# Patient Record
Sex: Female | Born: 1960 | Race: White | Hispanic: No | Marital: Married | Smoking: Former smoker
Health system: Southern US, Community
[De-identification: ages and names within clinical notes are randomized; demographics above are authoritative.]

## PROBLEM LIST (undated history)

## (undated) DIAGNOSIS — E785 Hyperlipidemia, unspecified: Secondary | ICD-10-CM

## (undated) DIAGNOSIS — F32A Depression, unspecified: Secondary | ICD-10-CM

## (undated) DIAGNOSIS — F419 Anxiety disorder, unspecified: Secondary | ICD-10-CM

## (undated) DIAGNOSIS — I10 Essential (primary) hypertension: Secondary | ICD-10-CM

## (undated) DIAGNOSIS — M4800 Spinal stenosis, site unspecified: Secondary | ICD-10-CM

## (undated) DIAGNOSIS — T7840XA Allergy, unspecified, initial encounter: Secondary | ICD-10-CM

## (undated) DIAGNOSIS — M1990 Unspecified osteoarthritis, unspecified site: Secondary | ICD-10-CM

## (undated) DIAGNOSIS — F329 Major depressive disorder, single episode, unspecified: Secondary | ICD-10-CM

## (undated) DIAGNOSIS — I639 Cerebral infarction, unspecified: Secondary | ICD-10-CM

## (undated) HISTORY — PX: TONSILLECTOMY: SUR1361

## (undated) HISTORY — DX: Depression, unspecified: F32.A

## (undated) HISTORY — PX: TOTAL KNEE ARTHROPLASTY: SHX125

## (undated) HISTORY — PX: BACK SURGERY: SHX140

## (undated) HISTORY — PX: ENDOMETRIAL ABLATION: SHX621

## (undated) HISTORY — PX: CHOLECYSTECTOMY: SHX55

## (undated) HISTORY — DX: Allergy, unspecified, initial encounter: T78.40XA

## (undated) HISTORY — DX: Spinal stenosis, site unspecified: M48.00

## (undated) HISTORY — PX: APPENDECTOMY: SHX54

## (undated) HISTORY — DX: Cerebral infarction, unspecified: I63.9

## (undated) HISTORY — DX: Hyperlipidemia, unspecified: E78.5

---
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
## 2015-10-26 ENCOUNTER — Encounter (INDEPENDENT_AMBULATORY_CARE_PROVIDER_SITE_OTHER): Payer: Self-pay | Admitting: *Deleted

## 2015-12-08 ENCOUNTER — Encounter (INDEPENDENT_AMBULATORY_CARE_PROVIDER_SITE_OTHER): Payer: Self-pay | Admitting: *Deleted

## 2015-12-08 ENCOUNTER — Other Ambulatory Visit (INDEPENDENT_AMBULATORY_CARE_PROVIDER_SITE_OTHER): Payer: Self-pay | Admitting: *Deleted

## 2015-12-08 ENCOUNTER — Other Ambulatory Visit (INDEPENDENT_AMBULATORY_CARE_PROVIDER_SITE_OTHER): Payer: Self-pay | Admitting: Internal Medicine

## 2015-12-08 DIAGNOSIS — Z1211 Encounter for screening for malignant neoplasm of colon: Secondary | ICD-10-CM

## 2015-12-08 DIAGNOSIS — Z800 Family history of malignant neoplasm of digestive organs: Secondary | ICD-10-CM

## 2015-12-08 NOTE — Telephone Encounter (Signed)
Needs trilyte 

## 2015-12-12 MED ORDER — PEG 3350-KCL-NA BICARB-NACL 420 G PO SOLR: 4000.0000 mL | Freq: Once | ORAL | Status: DC

## 2015-12-14 ENCOUNTER — Telehealth (INDEPENDENT_AMBULATORY_CARE_PROVIDER_SITE_OTHER): Payer: Self-pay | Admitting: *Deleted

## 2015-12-14 NOTE — Telephone Encounter (Signed)
Referring MD: lahti PCP: addis   Procedure: tcs w propofol  Reason/Indication:  Screening, fam hx colon ca  Has patient had this procedure before?  Yes, 10 yrs ago  If so, when, by whom and where?    Is there a family history of colon cancer?  Yes, mother, maternal aunt  Who?  What age when diagnosed?    Is patient diabetic?   no      Does patient have prosthetic heart valve or mechanical valve?  no  Do you have a pacemaker?  no  Has patient ever had endocarditis? no  Has patient had joint replacement within last 12 months?  no  Does patient tend to be constipated or take laxatives? some  Does patient have a history of alcohol/drug use?  no  Is patient on Coumadin, Plavix and/or Aspirin? yes  Medications: asa 81 mg daily, vit b12, vit d3, tizanidine 4 mg daily, gabapentin 300 mg daily, escitalopram 10 mg daily, vit c, tramadol 50 mg prn, clonazepam 0.5 mg bid, hctz 12.5 mg daily  Allergies: sulfur drugs  Medication Adjustment: asa 2 days  Procedure date & time: 01/12/16 at 150

## 2015-12-15 NOTE — Telephone Encounter (Signed)
agree

## 2016-01-04 NOTE — Patient Instructions (Signed)
Robin Hull  01/04/2016     @PREFPERIOPPHARMACY @   Your procedure is scheduled on 01/12/2016.  Report to [facility] at 12:20 A.M.  Call this number if you have problems the morning of surgery:  [phone]   Remember:  Do not eat food or drink liquids after midnight.  Take these medicines the morning of surgery with A SIP OF WATER Xanax, Zyrtec, Klonopin, Lexapro, Gabapentin, Zanaflex, Ultram   Do not wear jewelry, make-up or nail polish.  Do not wear lotions, powders, or perfumes.  You may wear deodorant.  Do not shave 48 hours prior to surgery.  Men may shave face and neck.  Do not bring valuables to the hospital.  [facility] belongings or valuables.  Contacts, dentures or bridgework may not be worn into surgery.  Leave your suitcase in the car.  After surgery it may be brought to your room.  For patients admitted to the hospital, discharge time will be determined by your treatment team.  Patients discharged the day of surgery will not be allowed to drive home.    Please read over the following fact sheets that you were given. Anesthesia Post-op Instructions     PATIENT INSTRUCTIONS POST-ANESTHESIA  IMMEDIATELY FOLLOWING SURGERY:  Do not drive or operate machinery for the first twenty four hours after surgery.  Do not make any important decisions for twenty four hours after surgery or while taking narcotic pain medications or sedatives.  If you develop intractable nausea and vomiting or a severe headache please notify your doctor immediately.  FOLLOW-UP:  Please make an appointment with your surgeon as instructed. You do not need to follow up with anesthesia unless specifically instructed to do so.  WOUND CARE INSTRUCTIONS (if applicable):  Keep a dry clean dressing on the anesthesia/puncture wound site if there is drainage.  Once the wound has quit draining you may leave it open to air.  Generally you should leave the bandage intact for  twenty four hours unless there is drainage.  If the epidural site drains for more than 36-48 hours please call the anesthesia department.  QUESTIONS?:  Please feel free to call your physician or the hospital operator if you have any questions, and they will be happy to assist you.      Colonoscopy A colonoscopy is an exam to look at the entire large intestine (colon). This exam can help find problems such as tumors, polyps, inflammation, and areas of bleeding. The exam takes about 1 hour.  [facility] PROVIDER KNOW ABOUT:   Any allergies you have.  All medicines you are taking, including vitamins, herbs, eye drops, creams, and over-the-counter medicines.  Previous problems you or members of your family have had with the use of anesthetics.  Any blood disorders you have.  Previous surgeries you have had.  Medical conditions you have. RISKS AND COMPLICATIONS  Generally, this is a safe procedure. However, as with any procedure, complications can occur. Possible complications include:  Bleeding.  Tearing or rupture of the colon wall.  Reaction to medicines given during the exam.  Infection (rare). BEFORE THE PROCEDURE   Ask your health care provider about changing or stopping your regular medicines.  You may be prescribed an oral bowel prep. This involves drinking a large amount of medicated liquid, starting the day before your procedure. The liquid will cause you to have multiple loose stools until your stool is almost clear or light green. This cleans out  your colon in preparation for the procedure.  Do not eat or drink anything else once you have started the bowel prep, unless your health care provider tells you it is safe to do so.  Arrange for someone to drive you home after the procedure. PROCEDURE   You will be given medicine to help you relax (sedative).  You will lie on your side with your knees bent.  A long, flexible tube with a light and camera on the end  (colonoscope) will be inserted through the rectum and into the colon. The camera sends video back to a computer screen as it moves through the colon. The colonoscope also releases carbon dioxide gas to inflate the colon. This helps your health care provider see the area better.  During the exam, your health care provider may take a small tissue sample (biopsy) to be examined under a microscope if any abnormalities are found.  The exam is finished when the entire colon has been viewed. AFTER THE PROCEDURE   Do not drive for 24 hours after the exam.  You may have a small amount of blood in your stool.  You may pass moderate amounts of gas and have mild abdominal cramping or bloating. This is caused by the gas used to inflate your colon during the exam.  Ask when your test results will be ready and how you will get your results. Make sure you get your test results.   This information is not intended to replace advice given to you by your health care provider. Make sure you discuss any questions you have with your health care provider.   Document Released: 08/02/2000 Document Revised: 05/26/2013 Document Reviewed: 04/12/2013 Elsevier Interactive Patient Education [employer].

## 2016-01-08 ENCOUNTER — Encounter (HOSPITAL_COMMUNITY): Payer: Self-pay

## 2016-01-08 ENCOUNTER — Encounter (HOSPITAL_COMMUNITY)
Admission: RE | Admit: 2016-01-08 | Discharge: 2016-01-08 | Disposition: A | Discharge status: Home / Self Care | Payer: BLUE CROSS/BLUE SHIELD | Source: Ambulatory Visit | Attending: Internal Medicine | Admitting: Internal Medicine

## 2016-01-08 DIAGNOSIS — Z808 Family history of malignant neoplasm of other organs or systems: Secondary | ICD-10-CM | POA: Diagnosis not present

## 2016-01-08 DIAGNOSIS — Z01812 Encounter for preprocedural laboratory examination: Secondary | ICD-10-CM | POA: Insufficient documentation

## 2016-01-08 HISTORY — DX: Unspecified osteoarthritis, unspecified site: M19.90

## 2016-01-08 HISTORY — DX: Essential (primary) hypertension: I10

## 2016-01-08 HISTORY — DX: Anxiety disorder, unspecified: F41.9

## 2016-01-08 LAB — CBC WITH DIFFERENTIAL/PLATELET
Basophils Absolute: 0.1 10*3/uL (ref 0.0–0.1)
Basophils Relative: 1 %
EOS PCT: 1 %
Eosinophils Absolute: 0.1 10*3/uL (ref 0.0–0.7)
HEMATOCRIT: 41.1 % (ref 36.0–46.0)
Hemoglobin: 13.2 g/dL (ref 12.0–15.0)
LYMPHS ABS: 2.6 10*3/uL (ref 0.7–4.0)
LYMPHS PCT: 26 %
MCH: 28.3 pg (ref 26.0–34.0)
MCHC: 32.1 g/dL (ref 30.0–36.0)
MCV: 88 fL (ref 78.0–100.0)
MONO ABS: 0.6 10*3/uL (ref 0.1–1.0)
MONOS PCT: 6 %
NEUTROS ABS: 6.6 10*3/uL (ref 1.7–7.7)
Neutrophils Relative %: 66 %
PLATELETS: 387 10*3/uL (ref 150–400)
RBC: 4.67 MIL/uL (ref 3.87–5.11)
RDW: 12.8 % (ref 11.5–15.5)
WBC: 9.9 10*3/uL (ref 4.0–10.5)

## 2016-01-08 LAB — BASIC METABOLIC PANEL
ANION GAP: 8 (ref 5–15)
BUN: 23 mg/dL — AB (ref 6–20)
CALCIUM: 9.2 mg/dL (ref 8.9–10.3)
CO2: 28 mmol/L (ref 22–32)
Chloride: 101 mmol/L (ref 101–111)
Creatinine, Ser: 0.64 mg/dL (ref 0.44–1.00)
GFR calc Af Amer: >60 mL/min (ref >60)
GLUCOSE: 77 mg/dL (ref 65–99)
Potassium: 4.1 mmol/L (ref 3.5–5.1)
Sodium: 137 mmol/L (ref 135–145)

## 2016-01-12 ENCOUNTER — Ambulatory Visit (HOSPITAL_COMMUNITY)
Admission: RE | Admit: 2016-01-12 | Discharge: 2016-01-12 | Disposition: A | Discharge status: Home / Self Care | Payer: BLUE CROSS/BLUE SHIELD | Source: Ambulatory Visit | Attending: Internal Medicine | Admitting: Internal Medicine

## 2016-01-12 ENCOUNTER — Encounter (HOSPITAL_COMMUNITY): Payer: Self-pay | Admitting: *Deleted

## 2016-01-12 ENCOUNTER — Encounter (HOSPITAL_COMMUNITY)
Admission: RE | Disposition: A | Discharge status: Home / Self Care | Payer: Self-pay | Source: Ambulatory Visit | Attending: Internal Medicine

## 2016-01-12 ENCOUNTER — Ambulatory Visit (HOSPITAL_COMMUNITY): Payer: BLUE CROSS/BLUE SHIELD | Admitting: Anesthesiology

## 2016-01-12 DIAGNOSIS — K5520 Angiodysplasia of colon without hemorrhage: Secondary | ICD-10-CM | POA: Insufficient documentation

## 2016-01-12 DIAGNOSIS — Z800 Family history of malignant neoplasm of digestive organs: Secondary | ICD-10-CM | POA: Diagnosis not present

## 2016-01-12 DIAGNOSIS — Z87891 Personal history of nicotine dependence: Secondary | ICD-10-CM | POA: Insufficient documentation

## 2016-01-12 DIAGNOSIS — Z79899 Other long term (current) drug therapy: Secondary | ICD-10-CM | POA: Insufficient documentation

## 2016-01-12 DIAGNOSIS — K6389 Other specified diseases of intestine: Secondary | ICD-10-CM | POA: Insufficient documentation

## 2016-01-12 DIAGNOSIS — K644 Residual hemorrhoidal skin tags: Secondary | ICD-10-CM | POA: Diagnosis not present

## 2016-01-12 DIAGNOSIS — F419 Anxiety disorder, unspecified: Secondary | ICD-10-CM | POA: Diagnosis not present

## 2016-01-12 DIAGNOSIS — M1990 Unspecified osteoarthritis, unspecified site: Secondary | ICD-10-CM | POA: Diagnosis not present

## 2016-01-12 DIAGNOSIS — I10 Essential (primary) hypertension: Secondary | ICD-10-CM | POA: Insufficient documentation

## 2016-01-12 DIAGNOSIS — Z1211 Encounter for screening for malignant neoplasm of colon: Secondary | ICD-10-CM | POA: Insufficient documentation

## 2016-01-12 DIAGNOSIS — G4730 Sleep apnea, unspecified: Secondary | ICD-10-CM | POA: Diagnosis not present

## 2016-01-12 HISTORY — PX: COLONOSCOPY WITH PROPOFOL: SHX5780

## 2016-01-12 SURGERY — COLONOSCOPY WITH PROPOFOL
Anesthesia: Monitor Anesthesia Care

## 2016-01-12 MED ORDER — PROPOFOL 10 MG/ML IV BOLUS
INTRAVENOUS | Status: AC
  Filled 2016-01-12: qty 20

## 2016-01-12 MED ORDER — GLYCOPYRROLATE 0.2 MG/ML IJ SOLN
0.2000 mg | Freq: Once | INTRAMUSCULAR | Status: AC
  Administered 2016-01-12: 0.2 mg via INTRAVENOUS

## 2016-01-12 MED ORDER — LACTATED RINGERS IV SOLN
INTRAVENOUS | Status: DC
  Administered 2016-01-12: 09:00:00 via INTRAVENOUS

## 2016-01-12 MED ORDER — ONDANSETRON HCL 4 MG/2ML IJ SOLN: 4.0000 mg | Freq: Once | INTRAMUSCULAR | Status: DC | PRN

## 2016-01-12 MED ORDER — FENTANYL CITRATE (PF) 100 MCG/2ML IJ SOLN: 25.0000 ug | INTRAMUSCULAR | Status: DC | PRN

## 2016-01-12 MED ORDER — PHENYLEPHRINE HCL 10 MG/ML IJ SOLN
INTRAMUSCULAR | Status: AC
Start: 2016-01-12 — End: 2016-01-12
  Filled 2016-01-12: qty 1

## 2016-01-12 MED ORDER — ONDANSETRON HCL 4 MG/2ML IJ SOLN
4.0000 mg | Freq: Once | INTRAMUSCULAR | Status: AC
  Administered 2016-01-12: 4 mg via INTRAVENOUS

## 2016-01-12 MED ORDER — FENTANYL CITRATE (PF) 100 MCG/2ML IJ SOLN
25.0000 ug | INTRAMUSCULAR | Status: AC
  Administered 2016-01-12: 25 ug via INTRAVENOUS

## 2016-01-12 MED ORDER — MIDAZOLAM HCL 2 MG/2ML IJ SOLN
1.0000 mg | INTRAMUSCULAR | Status: DC | PRN
  Administered 2016-01-12: 2 mg via INTRAVENOUS

## 2016-01-12 MED ORDER — FENTANYL CITRATE (PF) 100 MCG/2ML IJ SOLN
INTRAMUSCULAR | Status: AC
  Filled 2016-01-12: qty 2

## 2016-01-12 MED ORDER — MIDAZOLAM HCL 5 MG/5ML IJ SOLN
INTRAMUSCULAR | Status: DC | PRN
  Administered 2016-01-12 (×2): 1 mg via INTRAVENOUS

## 2016-01-12 MED ORDER — SODIUM CHLORIDE 0.9 % IJ SOLN
INTRAMUSCULAR | Status: AC
  Filled 2016-01-12: qty 10

## 2016-01-12 MED ORDER — MIDAZOLAM HCL 2 MG/2ML IJ SOLN
INTRAMUSCULAR | Status: AC
  Filled 2016-01-12: qty 2

## 2016-01-12 MED ORDER — PROPOFOL 500 MG/50ML IV EMUL
INTRAVENOUS | Status: DC | PRN
  Administered 2016-01-12: 100 ug/kg/min via INTRAVENOUS
  Administered 2016-01-12: 125 ug/kg/min via INTRAVENOUS

## 2016-01-12 MED ORDER — GLYCOPYRROLATE 0.2 MG/ML IJ SOLN
INTRAMUSCULAR | Status: AC
  Filled 2016-01-12: qty 1

## 2016-01-12 MED ORDER — ONDANSETRON HCL 4 MG/2ML IJ SOLN
INTRAMUSCULAR | Status: AC
  Filled 2016-01-12: qty 2

## 2016-01-12 NOTE — Discharge Instructions (Signed)
Resume usual medications and high fiber diet. Can take Dulcolax suppository per rectum on as-needed basis. No driving for 24 hours. Next colonoscopy in 5 years.  Colonoscopy, Care After Refer to this sheet in the next few weeks. These instructions provide you with information on caring for yourself after your procedure. Your health care provider may also give you more specific instructions. Your treatment has been planned according to current medical practices, but problems sometimes occur. Call your health care provider if you have any problems or questions after your procedure. WHAT TO EXPECT AFTER THE PROCEDURE  After your procedure, it is typical to have the following:  A small amount of blood in your stool.  Moderate amounts of gas and mild abdominal cramping or bloating. HOME CARE INSTRUCTIONS  Do not drive, operate machinery, or sign important documents for 24 hours.  You may shower and resume your regular physical activities, but move at a slower pace for the first 24 hours.  Take frequent rest periods for the first 24 hours.  Walk around or put a warm pack on your abdomen to help reduce abdominal cramping and bloating.  Drink enough fluids to keep your urine clear or pale yellow.  You may resume your normal diet as instructed by your health care provider. Avoid heavy or fried foods that are hard to digest.  Avoid drinking alcohol for 24 hours or as instructed by your health care provider.  Only take over-the-counter or prescription medicines as directed by your health care provider.  If a tissue sample (biopsy) was taken during your procedure:  Do not take aspirin or blood thinners for 7 days, or as instructed by your health care provider.  Do not drink alcohol for 7 days, or as instructed by your health care provider.  Eat soft foods for the first 24 hours. SEEK MEDICAL CARE IF: You have persistent spotting of blood in your stool 2-3 days after the procedure. SEEK  IMMEDIATE MEDICAL CARE IF:  You have more than a small spotting of blood in your stool.  You pass large blood clots in your stool.  Your abdomen is swollen (distended).  You have nausea or vomiting.  You have a fever.  You have increasing abdominal pain that is not relieved with medicine.   This information is not intended to replace advice given to you by your health care provider. Make sure you discuss any questions you have with your health care provider.   Document Released: 03/19/2004 Document Revised: 05/26/2013 Document Reviewed: 04/12/2013 Elsevier Interactive Patient Education 2016 Elsevier Inc.  High-Fiber Diet Fiber, also called dietary fiber, is a type of carbohydrate found in fruits, vegetables, whole grains, and beans. A high-fiber diet can have many health benefits. Your health care provider may recommend a high-fiber diet to help:  Prevent constipation. Fiber can make your bowel movements more regular.  Lower your cholesterol.  Relieve hemorrhoids, uncomplicated diverticulosis, or irritable bowel syndrome.  Prevent overeating as part of a weight-loss plan.  Prevent heart disease, type 2 diabetes, and certain cancers. WHAT IS MY PLAN? The recommended daily intake of fiber includes:  38 grams for men under age 50.  30 grams for men over age 27.  25 grams for women under age 16.  21 grams for women over age 4. You can get the recommended daily intake of dietary fiber by eating a variety of fruits, vegetables, grains, and beans. Your health care provider may also recommend a fiber supplement if it is not possible to  get enough fiber through your diet. WHAT DO I NEED TO KNOW ABOUT A HIGH-FIBER DIET?  Fiber supplements have not been widely studied for their effectiveness, so it is better to get fiber through food sources.  Always check the fiber content on thenutrition facts label of any prepackaged food. Look for foods that contain at least 5 grams of fiber  per serving.  Ask your dietitian if you have questions about specific foods that are related to your condition, especially if those foods are not listed in the following section.  Increase your daily fiber consumption gradually. Increasing your intake of dietary fiber too quickly may cause bloating, cramping, or gas.  Drink plenty of water. Water helps you to digest fiber. WHAT FOODS CAN I EAT? Grains Whole-grain breads. Multigrain cereal. Oats and oatmeal. Brown rice. Barley. Bulgur wheat. Millet. Bran muffins. Popcorn. Rye wafer crackers. Vegetables Sweet potatoes. Spinach. Kale. Artichokes. Cabbage. Broccoli. Green peas. Carrots. Squash. Fruits Berries. Pears. Apples. Oranges. Avocados. Prunes and raisins. Dried figs. Meats and Other Protein Sources Navy, kidney, pinto, and soy beans. Split peas. Lentils. Nuts and seeds. Dairy Fiber-fortified yogurt. Beverages Fiber-fortified soy milk. Fiber-fortified orange juice. Other Fiber bars. The items listed above may not be a complete list of recommended foods or beverages. Contact your dietitian for more options. WHAT FOODS ARE NOT RECOMMENDED? Grains White bread. Pasta made with refined flour. White rice. Vegetables Fried potatoes. Canned vegetables. Well-cooked vegetables.  Fruits Fruit juice. Cooked, strained fruit. Meats and Other Protein Sources Fatty cuts of meat. Fried Environmental education officerpoultry or fried fish. Dairy Milk. Yogurt. Cream cheese. Sour cream. Beverages Soft drinks. Other Cakes and pastries. Butter and oils. The items listed above may not be a complete list of foods and beverages to avoid. Contact your dietitian for more information. WHAT ARE SOME TIPS FOR INCLUDING HIGH-FIBER FOODS IN MY DIET?  Eat a wide variety of high-fiber foods.  Make sure that half of all grains consumed each day are whole grains.  Replace breads and cereals made from refined flour or white flour with whole-grain breads and cereals.  Replace white  rice with brown rice, bulgur wheat, or millet.  Start the day with a breakfast that is high in fiber, such as a cereal that contains at least 5 grams of fiber per serving.  Use beans in place of meat in soups, salads, or pasta.  Eat high-fiber snacks, such as berries, raw vegetables, nuts, or popcorn.   This information is not intended to replace advice given to you by your health care provider. Make sure you discuss any questions you have with your health care provider.   Document Released: 08/05/2005 Document Revised: 08/26/2014 Document Reviewed: 01/18/2014 Elsevier Interactive Patient Education 2016 Elsevier Inc.  PATIENT INSTRUCTIONS POST-ANESTHESIA  IMMEDIATELY FOLLOWING SURGERY:  Do not drive or operate machinery for the first twenty four hours after surgery.  Do not make any important decisions for twenty four hours after surgery or while taking narcotic pain medications or sedatives.  If you develop intractable nausea and vomiting or a severe headache please notify your doctor immediately.  FOLLOW-UP:  Please make an appointment with your surgeon as instructed. You do not need to follow up with anesthesia unless specifically instructed to do so.  WOUND CARE INSTRUCTIONS (if applicable):  Keep a dry clean dressing on the anesthesia/puncture wound site if there is drainage.  Once the wound has quit draining you may leave it open to air.  Generally you should leave the bandage intact for twenty  four hours unless there is drainage.  If the epidural site drains for more than 36-48 hours please call the anesthesia department.  QUESTIONS?:  Please feel free to call your physician or the hospital operator if you have any questions, and they will be happy to assist you.

## 2016-01-12 NOTE — H&P (Signed)
Robin Hull is an 55 y.o. female.   Chief Complaint: Patient is here for colonoscopy. HPI: Patient is 55 year old Caucasian female who is here for screening colonoscopy. Last exam was normal 10 years ago. She has chronic constipation and is using polyethylene glycol. She has occasional hematochezia when she strains. She denies frank bleeding. Family history significant for CRC in mother who was 1062 at the time of diagnosis and required chemotherapy. She is doing fine. Her brother has had multiple colonic polyps.  Past Medical History  Diagnosis Date  . Hypertension   . Anxiety   . Arthritis     Past Surgical History  Procedure Laterality Date  . Total knee arthroplasty Right   . Cholecystectomy    . Appendectomy    . Back surgery      x 3  . Tonsillectomy    . Endometrial ablation      History reviewed. No pertinent family history. Social History:  reports that she quit smoking about 7 years ago. She does not have any smokeless tobacco history on file. She reports that she does not drink alcohol or use illicit drugs.  Allergies:  Allergies  Allergen Reactions  . Sulfa Antibiotics Nausea And Vomiting    Medications Prior to Admission  Medication Sig Dispense Refill  . ALPRAZolam (XANAX) 0.5 MG tablet Take 1 tablet by mouth as needed (ONLY NEEDS WHEN FLYING).   1  . cetirizine (ZYRTEC) 10 MG tablet Take 10 mg by mouth daily.    . clonazePAM (KLONOPIN) 0.5 MG tablet Take 0.5 mg by mouth 2 (two) times daily.  4  . [employer] D3 2000 units CAPS Take 1 capsule by mouth daily.  6  . [employer] VITAMIN B12 1000 MCG tablet Take 1 tablet by mouth daily.  6  . [employer] VITAMIN C 250 MG tablet Take 250 mg by mouth 2 (two) times daily.  1  . escitalopram (LEXAPRO) 10 MG tablet Take 10 mg by mouth daily.  6  . gabapentin (NEURONTIN) 300 MG capsule Take 300 mg by mouth 2 (two) times daily as needed (pain).   1  . hydrochlorothiazide (MICROZIDE) 12.5 MG capsule Take 12.5 mg by mouth daily.  1  . polyethylene  glycol-electrolytes (NULYTELY/GOLYTELY) 420 g solution Take 4,000 mLs by mouth once. 4000 mL 0  . tiZANidine (ZANAFLEX) 4 MG tablet Take 4 mg by mouth at bedtime.  1  . traMADol (ULTRAM) 50 MG tablet Take 50 mg by mouth 3 (three) times daily as needed for moderate pain.   0    No results found for this or any previous visit (from the past 48 hour(s)). No results found.  ROS  Blood pressure 127/65, temperature 97.6 F (36.4 C), temperature source Oral, resp. rate 18, SpO2 100 %. Physical Exam  Constitutional: She appears well-developed and well-nourished.  HENT:  Mouth/Throat: Oropharynx is clear and moist.  Eyes: Conjunctivae are normal. No scleral icterus.  Neck: No thyromegaly present.  Cardiovascular: Normal rate, regular rhythm and normal heart sounds.   No murmur heard. Respiratory: Effort normal and breath sounds normal.  GI:  Abdomen is obese but soft and nontender without organomegaly or masses.  Musculoskeletal: She exhibits no edema.  Lymphadenopathy:    She has no cervical adenopathy.  Neurological: She is alert.  Skin: Skin is warm and dry.     Assessment/Plan High risk screening colonoscopy under monitored anesthesia care. Family history of CRC in mother and multiple polyps in her brother.  Lionel DecemberNajeeb Caetano Oberhaus, MD 01/12/2016,  9:24 AM

## 2016-01-12 NOTE — Anesthesia Postprocedure Evaluation (Signed)
Anesthesia Post Note  Patient: Robin Hull  Procedure(s) Performed: Procedure(s) (LRB): COLONOSCOPY WITH PROPOFOL (N/A)  Patient location during evaluation: PACU Anesthesia Type: MAC Level of consciousness: awake, oriented and patient cooperative Pain management: pain level controlled Vital Signs Assessment: post-procedure vital signs reviewed and stable Respiratory status: spontaneous breathing, nonlabored ventilation and respiratory function stable Cardiovascular status: blood pressure returned to baseline Postop Assessment: no signs of nausea or vomiting Anesthetic complications: no    Last Vitals:  Filed Vitals:   01/12/16 0835 01/12/16 0840  BP: 133/64 127/65  Temp:    Resp: 13 18    Last Pain: There were no vitals filed for this visit.               Raynee Mccasland J

## 2016-01-12 NOTE — Transfer of Care (Signed)
Immediate Anesthesia Transfer of Care Note  Patient: Robin Hull  Procedure(s) Performed: Procedure(s) with comments: COLONOSCOPY WITH PROPOFOL (N/A) - 1:50-moved to 925 Ann notified pt  Patient Location: PACU  Anesthesia Type:MAC  Level of Consciousness: awake and patient cooperative  Airway & Oxygen Therapy: Patient Spontanous Breathing and Patient connected to face mask oxygen  Post-op Assessment: Report given to RN, Post -op Vital signs reviewed and stable and Patient moving all extremities  Post vital signs: Reviewed and stable  Last Vitals:  Filed Vitals:   01/12/16 0835 01/12/16 0840  BP: 133/64 127/65  Temp:    Resp: 13 18    Last Pain: There were no vitals filed for this visit.    Patients Stated Pain Goal: 7 (01/12/16 0746)  Complications: No apparent anesthesia complications

## 2016-01-12 NOTE — Anesthesia Preprocedure Evaluation (Signed)
Anesthesia Evaluation  Patient identified by MRN, date of birth, ID band Patient awake    Reviewed: Allergy & Precautions, NPO status , Patient's Chart, lab work & pertinent test results  Airway Mallampati: II  TM Distance: >3 FB     Dental  (+) Teeth Intact   Pulmonary sleep apnea (uses O2 at night) , former smoker,    breath sounds clear to auscultation       Cardiovascular hypertension, Pt. on medications  Rhythm:Regular Rate:Normal     Neuro/Psych PSYCHIATRIC DISORDERS Anxiety    GI/Hepatic   Endo/Other    Renal/GU      Musculoskeletal   Abdominal   Peds  Hematology   Anesthesia Other Findings   Reproductive/Obstetrics                             Anesthesia Physical Anesthesia Plan  ASA: II  Anesthesia Plan: MAC   Post-op Pain Management:    Induction: Intravenous  Airway Management Planned: Simple Face Mask  Additional Equipment:   Intra-op Plan:   Post-operative Plan:   Informed Consent: I have reviewed the patients History and Physical, chart, labs and discussed the procedure including the risks, benefits and alternatives for the proposed anesthesia with the patient or authorized representative who has indicated his/her understanding and acceptance.     Plan Discussed with:   Anesthesia Plan Comments:         Anesthesia Quick Evaluation

## 2016-01-12 NOTE — Op Note (Signed)
[facility] Patient Name: Robin Hull Procedure Date: 01/12/2016 9:16 AM MRN: [id number] Date of Birth: May 30, 1961 Attending MD: Lionel DecemberNajeeb Florabelle Cardin , MD CSN: [id number] Age: [age] Admit Type: Outpatient Procedure:                Colonoscopy Indications:              Screening in patient at increased risk: Colorectal                            cancer in mother 5660 or older Providers:                Lionel DecemberNajeeb Jamen Loiseau, MD, Brain HiltsLurae Albert, RN, Birder Robsonebra                            Houghton, Technician Referring MD:             Renaldo Harrisonaniel Addis, DO Medicines:                Propofol per Anesthesia Complications:            No immediate complications. Estimated Blood Loss:     Estimated blood loss: none. Procedure:                Pre-Anesthesia Assessment:                           - Prior to the procedure, a History and Physical                            was performed, and patient medications and                            allergies were reviewed. The patient's tolerance of                            previous anesthesia was also reviewed. The risks                            and benefits of the procedure and the sedation                            options and risks were discussed with the patient.                            All questions were answered, and informed consent                            was obtained. Prior Anticoagulants: The patient has                            taken no previous anticoagulant or antiplatelet                            agents. ASA Grade Assessment: II - A patient with  mild systemic disease. After reviewing the risks                            and benefits, the patient was deemed in                            satisfactory condition to undergo the procedure.                           After obtaining informed consent, the colonoscope                            was passed under direct vision. Throughout the                            procedure, the  patient's blood pressure, pulse, and                            oxygen saturations were monitored continuously. The                            EC-[id number] ([id number]) scope was introduced through                            the anus and advanced to the the cecum, identified                            by appendiceal orifice and ileocecal valve. The                            colonoscopy was performed without difficulty. The                            patient tolerated the procedure well. The quality                            of the bowel preparation was fair. Anatomical                            landmarks were photographed. The quality of the                            bowel preparation was fair. Scope In: 9:38:10 AM Scope Out: 9:52:05 AM Scope Withdrawal Time: 0 hours 7 minutes 38 seconds  Total Procedure Duration: 0 hours 13 minutes 55 seconds  Findings:      Three small angioectasias without bleeding were found at the hepatic       flexure and in the cecum.      A patchy area of mild melanosis was found in the entire colon.      External hemorrhoids were found during retroflexion. The hemorrhoids       were small. Impression:               - Preparation of the colon was fair.                           -  Three non-bleeding colonic angioectasias.                           - Melanosis in the colon.                           - External hemorrhoids.                           - No specimens collected. Moderate Sedation:      Per Anesthesia Care Recommendation:           - Patient has a contact number available for                            emergencies. The signs and symptoms of potential                            delayed complications were discussed with the                            patient. Return to normal activities tomorrow.                            Written discharge instructions were provided to the                            patient.                           - High fiber diet  today.                           - Continue present medications.                           - Repeat colonoscopy in 5 years for screening                            purposes. Procedure Code(s):        --- Professional ---                           [phone], Colonoscopy, flexible; diagnostic, including                            collection of specimen(s) by brushing or washing,                            when performed (separate procedure) Diagnosis Code(s):        --- Professional ---                           Z80.0, Family history of malignant neoplasm of                            digestive organs  K64.4, Residual hemorrhoidal skin tags                           K55.20, Angiodysplasia of colon without hemorrhage                           K63.89, Other specified diseases of intestine CPT copyright 2016 American Medical Association. All rights reserved. The codes documented in this report are preliminary and upon coder review may  be revised to meet current compliance requirements. Lionel December, MD Lionel December, MD 01/12/2016 9:59:20 AM This report has been signed electronically. Number of Addenda: 0

## 2016-01-19 ENCOUNTER — Encounter (HOSPITAL_COMMUNITY): Payer: Self-pay | Admitting: Internal Medicine

## 2018-12-09 ENCOUNTER — Ambulatory Visit (INDEPENDENT_AMBULATORY_CARE_PROVIDER_SITE_OTHER): Payer: BLUE CROSS/BLUE SHIELD | Admitting: Gastroenterology

## 2018-12-09 ENCOUNTER — Other Ambulatory Visit: Payer: Self-pay

## 2018-12-09 ENCOUNTER — Encounter: Payer: Self-pay | Admitting: Gastroenterology

## 2018-12-09 VITALS — BP 114/80 | Ht 63.5 in | Wt 215.0 lb

## 2018-12-09 DIAGNOSIS — M533 Sacrococcygeal disorders, not elsewhere classified: Secondary | ICD-10-CM | POA: Diagnosis not present

## 2018-12-09 DIAGNOSIS — K5902 Outlet dysfunction constipation: Secondary | ICD-10-CM

## 2018-12-09 DIAGNOSIS — R339 Retention of urine, unspecified: Secondary | ICD-10-CM

## 2018-12-09 DIAGNOSIS — M25559 Pain in unspecified hip: Secondary | ICD-10-CM

## 2018-12-09 MED ORDER — LINACLOTIDE 290 MCG PO CAPS: 290.0000 ug | ORAL_CAPSULE | Freq: Every day | ORAL | 3 refills | Status: AC

## 2018-12-09 NOTE — Patient Instructions (Addendum)
If you are age 57 or older, your body mass index should be between 23-30. Your Body mass index is 37.49 kg/m. If this is out of the aforementioned range listed, please consider follow up with your Primary Care Provider.  If you are age 23 or younger, your body mass index should be between 19-25. Your Body mass index is 37.49 kg/m. If this is out of the aformentioned range listed, please consider follow up with your Primary Care Provider.   You have been scheduled for an MRI at [facility] on              . Your appointment time is       . Please arrive 15 minutes prior to your appointment time for registration purposes. Please make certain not to have anything to eat or drink 6 hours prior to your test. In addition, if you have any metal in your body, have a pacemaker or defibrillator, please be sure to let your ordering physician know. This test typically takes 45 minutes to 1 hour to complete. Should you need to reschedule, please call [phone] to do so. [facility] WILL BE CONTACTING YOU.  We have sent the following medications to your pharmacy for you to pick up at your convenience: Linzess 290 mcg daily, take it on empty stomach in the morning before breakfast.  Glycerin suppository - use every other day along with daily digital rectal stimulation.  Continue pelvic floor physical therapy.  Follow-up in office visit/tele visit in 4 to 6 weeks after MRI.  Please call the office to make an appointment as the schedule is not available for June.  We will consider referral for EMG/pudendal nerve conduction study based on MRI findings   Thank you for choosing me and [facility].   Marsa Aris, MD

## 2018-12-09 NOTE — Progress Notes (Signed)
Robin Hull    [id number]    October 01, 1960  Primary Care Physician:Addis, Reuel Boom DO  Referring Physician: Renaldo Harrison, DO [address] [place], [place] [address]  This service was provided via audio and video telemedicine (Doximity) due to COVID 19 pandemic.  Patient location: Home Provider location: Office Used 2 patient identifiers to confirm the correct person. Explained the limitations in evaluation and management via telemedicine. Patient is aware of potential medical charges for this visit.  Patient consented to this virtual visit.  The persons participating in this telemedicine service were myself and the patient  Chief complaint:  Constipation HPI: 58 year old female here with complaints of constipation, difficulty evacuating since she had a fall in January 2020  Colonoscopy Jan 12, 2016 by Dr. Karilyn Cota showed 3 small angiectasia nonbleeding bleeding, mild melanosis coli and hemorrhoids.  Spinal stenosis surgery Dec 2018, complicated by blood clot, wound infection, had to redo surgery, wound VAC vac. IV antibiotc and oral antibiotics for almost 1 year.  Jan 2020, her foot got coaught in the tape and she had a fall, since then she is having pain during urination radiating down her groin, had some blood on UA, initially was thought to be urinary infection, treated with antibiotics with no improvement pain.  Since then she has also been constipated, had fecal impaction.  She can only evacuate when she gets an enema despite taking laxatives.  She is currently taking Linzess and Dulcolax with no improvement. She is undergoing pelvic floor physical therapy for dysuria and pelvic pain   Outpatient Encounter Medications as of 12/09/2018  Medication Sig  . ALPRAZolam (XANAX) 0.25 MG tablet Take 0.25 mg by mouth at bedtime as needed for anxiety. Insert in vagina once daily ? Not sure of mg  . ALPRAZolam (XANAX) 0.5 MG tablet Take 1 tablet by mouth as needed (ONLY NEEDS WHEN  FLYING).   Marland Kitchen cetirizine (ZYRTEC) 10 MG tablet Take 10 mg by mouth daily.  . clonazePAM (KLONOPIN) 0.5 MG tablet Take 0.5 mg by mouth 2 (two) times daily.  . [employer] D3 2000 units CAPS Take 1 capsule by mouth daily.  . [employer]S VITAMIN B12 1000 MCG tablet Take 1 tablet by mouth daily.  . [employer]S VITAMIN C 250 MG tablet Take 250 mg by mouth 2 (two) times daily.  Marland Kitchen escitalopram (LEXAPRO) 10 MG tablet Take 10 mg by mouth daily.  . hydrochlorothiazide (MICROZIDE) 12.5 MG capsule Take 12.5 mg by mouth daily.  Marland Kitchen HYDROcodone-acetaminophen (NORCO) 7.5-325 MG tablet Take 0.5 tablets by mouth 4 (four) times daily.  Marland Kitchen linaclotide (LINZESS) 72 MCG capsule Take 72 mcg by mouth daily before breakfast. NOT SURE OF MG  . pregabalin (LYRICA) 50 MG capsule Take 50 mg by mouth at bedtime. NOT SURE OF MG  . tiZANidine (ZANAFLEX) 4 MG tablet Take 4 mg by mouth at bedtime.  . [DISCONTINUED] gabapentin (NEURONTIN) 300 MG capsule Take 300 mg by mouth 2 (two) times daily as needed (pain).   . [DISCONTINUED] traMADol (ULTRAM) 50 MG tablet Take 50 mg by mouth 3 (three) times daily as needed for moderate pain.    No facility-administered encounter medications on file as of 12/09/2018.     Allergies as of 12/09/2018 - Review Complete 12/09/2018  Allergen Reaction Noted  . Sulfa antibiotics Nausea And Vomiting 12/29/2015    Past Medical History:  Diagnosis Date  . Anxiety   . Arthritis   . Hypertension   . Spinal stenosis  Past Surgical History:  Procedure Laterality Date  . APPENDECTOMY    . BACK SURGERY     x 3  . CHOLECYSTECTOMY    . COLONOSCOPY WITH PROPOFOL N/A 01/12/2016   Procedure: COLONOSCOPY WITH PROPOFOL;  Surgeon: Malissa Hippo, MD;  Location: [facility];  Service: Endoscopy;  Laterality: N/A;  1:50-moved to [address] notified pt  . ENDOMETRIAL ABLATION    . TONSILLECTOMY    . TOTAL KNEE ARTHROPLASTY Right     Family History  Problem Relation Age of Onset  . COPD Mother   . Colon cancer Mother    . Other Father        died of natural causes  . Arthritis Maternal Grandmother     Social History   Socioeconomic History  . Marital status: Married    Spouse name: Not on file  . Number of children: 1  . Years of education: Not on file  . Highest education level: Not on file  Occupational History  . Occupation: part time school teacher  Social Needs  . Financial resource strain: Not on file  . Food insecurity:    Worry: Not on file    Inability: Not on file  . Transportation needs:    Medical: Not on file    Non-medical: Not on file  Tobacco Use  . Smoking status: Former Smoker    Packs/day: 1.00    Years: 35.00    Pack years: 35.00    Last attempt to quit: 01/07/2009    Years since quitting: 9.9  . Smokeless tobacco: Never Used  Substance and Sexual Activity  . Alcohol use: No  . Drug use: No  . Sexual activity: Not on file  Lifestyle  . Physical activity:    Days per week: Not on file    Minutes per session: Not on file  . Stress: Not on file  Relationships  . Social connections:    Talks on phone: Not on file    Gets together: Not on file    Attends religious service: Not on file    Active member of club or organization: Not on file    Attends meetings of clubs or organizations: Not on file    Relationship status: Not on file  . Intimate partner violence:    Fear of current or ex partner: Not on file    Emotionally abused: Not on file    Physically abused: Not on file    Forced sexual activity: Not on file  Other Topics Concern  . Not on file  Social History Narrative  . Not on file      Review of systems: Review of Systems as per HPI All other systems reviewed and are negative.   Physical Exam: Vitals were not taken and physical exam was not performed during this virtual visit.  Data Reviewed:  Reviewed labs, radiology imaging, old records and pertinent past GI work up   Assessment and Plan/Recommendations:  58 year old female with  history of lumbar spinal surgery complicated by wound infection, with dysuria, pelvic pain, urinary retention, constipation with difficulty evacuating after a fall in January 2020 Her symptoms started acutely after the fall in January.  She lost sensation/urgency for both urine and feces.  Concerning for possible nerve injury or compression especially given her history of spinal surgery and acute symptoms after the fall Will obtain MRI lumbosacral spine and pelvis to further evaluate.  Based on findings of MRI may need to consider EMG/pudendal nerve  conduction study  Continue pelvic floor physical therapy Linzess 290 mcg daily Use glycerin suppositories every other day with daily digital rectal stimulation  Return in 4 to 6 weeks or sooner if needed    K. Scherry Ran , MD   CC: Renaldo Harrison, DO

## 2018-12-15 ENCOUNTER — Telehealth: Payer: Self-pay

## 2018-12-17 NOTE — Telephone Encounter (Signed)
Ok thanks 

## 2018-12-18 ENCOUNTER — Telehealth: Payer: Self-pay | Admitting: Gastroenterology

## 2018-12-18 NOTE — Telephone Encounter (Signed)
Called patient with no answer and no voicemail to leave a message. 

## 2018-12-18 NOTE — Telephone Encounter (Signed)
Patient said that the med Robin Hull is not working as it should she has only had 2 bowel moments in the pass week.

## 2018-12-21 NOTE — Telephone Encounter (Signed)
Dr Nandigam Please advise  

## 2018-12-22 NOTE — Telephone Encounter (Signed)
She has difficulty evacuating due to pelvic outlet dysfunction, will need to exclude no compression or injury. await results of MRI spine and pelvis   Continue Linzess 290 mcg daily and glycerin suppository with digital rectal stimulation every other day for now along with pelvic floor physical therapy

## 2018-12-23 NOTE — Telephone Encounter (Signed)
Yes agree,  if she is not able to evacuate despite high dose laxative , will need to do bowel purge with Miralax (mix 1/2 bottle in 64 oz liquid)

## 2018-12-23 NOTE — Telephone Encounter (Signed)
Patient returned phone call. Best # [phone]

## 2018-12-23 NOTE — Telephone Encounter (Signed)
Explained to patient Dr Sharol Given recommendations she will try the Miralax purge  Dr Lavon Paganini patient wants her MRI moved up to a sooner appt.[facility] moved her appt out because its not an emergency  Is she ok to wait or do I need to tall them the MRI is Urgent?

## 2018-12-23 NOTE — Telephone Encounter (Signed)
Patient is stating she has been using the Glycerin suppositories and the 290 Linzess and its not working......Marland Kitchen Also tried Fleet enemas that has not worked   Pts MRI has been moved till 6/16th    I mentioned maybe a bowel purge with Miralax and gaterade To help her.....Marland Kitchen I told her I would get with you and see what you want to do  Dr Lavon Paganini please advise

## 2018-12-24 NOTE — Telephone Encounter (Signed)
Please request them to do it sooner given she is having significant issue with her bladder and bowel.  I am concerned about possible nerve injury.  Thank you

## 2019-01-09 ENCOUNTER — Other Ambulatory Visit: Payer: Self-pay

## 2019-01-09 ENCOUNTER — Ambulatory Visit
Admission: RE | Admit: 2019-01-09 | Discharge: 2019-01-09 | Disposition: A | Discharge status: Home / Self Care | Payer: BLUE CROSS/BLUE SHIELD | Source: Ambulatory Visit | Attending: Gastroenterology | Admitting: Gastroenterology

## 2019-01-09 DIAGNOSIS — M533 Sacrococcygeal disorders, not elsewhere classified: Secondary | ICD-10-CM

## 2019-01-09 DIAGNOSIS — M25559 Pain in unspecified hip: Secondary | ICD-10-CM

## 2019-01-09 MED ORDER — GADOBENATE DIMEGLUMINE 529 MG/ML IV SOLN
20.0000 mL | Freq: Once | INTRAVENOUS | Status: AC | PRN
  Administered 2019-01-09: 20 mL via INTRAVENOUS

## 2019-01-14 ENCOUNTER — Ambulatory Visit (AMBULATORY_SURGERY_CENTER): Payer: Self-pay

## 2019-01-14 ENCOUNTER — Other Ambulatory Visit: Payer: Self-pay

## 2019-01-14 VITALS — Ht 63.5 in | Wt 208.0 lb

## 2019-01-14 DIAGNOSIS — K5902 Outlet dysfunction constipation: Secondary | ICD-10-CM

## 2019-01-14 MED ORDER — NA SULFATE-K SULFATE-MG SULF 17.5-3.13-1.6 GM/177ML PO SOLN: 1.0000 | Freq: Once | ORAL | 0 refills | Status: AC

## 2019-01-14 NOTE — Progress Notes (Signed)
Denies allergies to eggs or soy products. Denies complication of anesthesia or sedation. Denies use of weight loss medication. Denies use of O2.   Emmi instructions given for colonoscopy.  Pre-Visit was conducted by phone due to Covid 19. Instructions were reviewed and mailed to patients confirmed home address. Patient was encouraged to call if she had any questions or concerns. 

## 2019-01-15 ENCOUNTER — Encounter: Payer: Self-pay | Admitting: Gastroenterology

## 2019-01-18 ENCOUNTER — Telehealth: Payer: Self-pay

## 2019-01-18 NOTE — Telephone Encounter (Signed)
Covid-19 screening questions  Have you traveled in the last 14 days? Lives in [place], [place]. Other than that she has not traveled out of state. If yes where?  Do you now or have you had a fever in the last 14 days? No.  Do you have any respiratory symptoms of shortness of breath or cough now or in the last 14 days? No.  Do you have any family members or close contacts with diagnosed or suspected Covid-19 in the past 14 No. days?  Have you been tested for Covid-19 and found to be positive? No.

## 2019-01-19 ENCOUNTER — Telehealth: Payer: Self-pay | Admitting: *Deleted

## 2019-01-19 NOTE — Telephone Encounter (Signed)
Chart entered in error

## 2019-01-20 ENCOUNTER — Encounter: Payer: Self-pay | Admitting: Gastroenterology

## 2019-01-20 ENCOUNTER — Ambulatory Visit (AMBULATORY_SURGERY_CENTER): Payer: BC Managed Care – PPO | Admitting: Gastroenterology

## 2019-01-20 ENCOUNTER — Other Ambulatory Visit: Payer: Self-pay

## 2019-01-20 VITALS — BP 128/69 | HR 56 | Temp 97.7°F | Resp 14 | Ht 63.0 in | Wt 215.0 lb

## 2019-01-20 DIAGNOSIS — Z538 Procedure and treatment not carried out for other reasons: Secondary | ICD-10-CM | POA: Diagnosis not present

## 2019-01-20 DIAGNOSIS — R933 Abnormal findings on diagnostic imaging of other parts of digestive tract: Secondary | ICD-10-CM

## 2019-01-20 DIAGNOSIS — K629 Disease of anus and rectum, unspecified: Secondary | ICD-10-CM

## 2019-01-20 MED ORDER — SODIUM CHLORIDE 0.9 % IV SOLN: 500.0000 mL | Freq: Once | INTRAVENOUS | Status: DC

## 2019-01-20 MED ORDER — PEG 3350-KCL-NA BICARB-NACL 420 G PO SOLR: ORAL | 0 refills | Status: AC

## 2019-01-20 MED ORDER — METOCLOPRAMIDE HCL 5 MG PO TABS: ORAL_TABLET | ORAL | 0 refills | Status: AC

## 2019-01-20 NOTE — Patient Instructions (Signed)
Continue present medications.      YOU HAD AN ENDOSCOPIC PROCEDURE TODAY AT THE [facility]:   Refer to the procedure report that was given to you for any specific questions about what was found during the examination.  If the procedure report does not answer your questions, please call your gastroenterologist to clarify.  If you requested that your care partner not be given the details of your procedure findings, then the procedure report has been included in a sealed envelope for you to review at your convenience later.  YOU SHOULD EXPECT: Some feelings of bloating in the abdomen. Passage of more gas than usual.  Walking can help get rid of the air that was put into your GI tract during the procedure and reduce the bloating. If you had a lower endoscopy (such as a colonoscopy or flexible sigmoidoscopy) you may notice spotting of blood in your stool or on the toilet paper. If you underwent a bowel prep for your procedure, you may not have a normal bowel movement for a few days.  Please Note:  You might notice some irritation and congestion in your nose or some drainage.  This is from the oxygen used during your procedure.  There is no need for concern and it should clear up in a day or so.  SYMPTOMS TO REPORT IMMEDIATELY:   Following lower endoscopy (colonoscopy or flexible sigmoidoscopy):  Excessive amounts of blood in the stool  Significant tenderness or worsening of abdominal pains  Swelling of the abdomen that is new, acute  Fever of 100F or higher    For urgent or emergent issues, a gastroenterologist can be reached at any hour by calling [phone].   DIET: Clear liquid diet for next 48 hours to prep for endoscopy procedure on Friday.  ACTIVITY:  You should plan to take it easy for the rest of today and you should NOT DRIVE or use heavy machinery until tomorrow (because of the sedation medicines used during the test).    FOLLOW UP: Our staff will call the  number listed on your records 48-72 hours following your procedure to check on you and address any questions or concerns that you may have regarding the information given to you following your procedure. If we do not reach you, we will leave a message.  We will attempt to reach you two times.  During this call, we will ask if you have developed any symptoms of COVID 19. If you develop any symptoms (ie: fever, flu-like symptoms, shortness of breath, cough etc.) before then, please call [phone].  If you test positive for Covid 19 in the 2 weeks post procedure, please call and report this information to [place].    If any biopsies were taken you will be contacted by phone or by letter within the next 1-3 weeks.  Please call us at [phone] if you have not heard about the biopsies in 3 weeks.    SIGNATURES/CONFIDENTIALITY: You and/or your care partner have signed paperwork which will be entered into your electronic medical record.  These signatures attest to the fact that that the information above on your After Visit Summary has been reviewed and is understood.  Full responsibility of the confidentiality of this discharge information lies with you and/or your care-partner.

## 2019-01-20 NOTE — Op Note (Signed)
[facility] Patient Name: Robin Hull Procedure Date: 01/20/2019 2:42 PM MRN: [id number] Endoscopist: Napoleon Form , MD Age: 58 Referring MD:  Date of Birth: 04-May-1961 Gender: Female Account #: [id number] Procedure:                Colonoscopy Indications:              Abnormal CT of the GI tract Medicines:                Monitored Anesthesia Care Procedure:                Pre-Anesthesia Assessment:                           - Prior to the procedure, a History and Physical                            was performed, and patient medications and                            allergies were reviewed. The patient's tolerance of                            previous anesthesia was also reviewed. The risks                            and benefits of the procedure and the sedation                            options and risks were discussed with the patient.                            All questions were answered, and informed consent                            was obtained. Prior Anticoagulants: The patient has                            taken no previous anticoagulant or antiplatelet                            agents. ASA Grade Assessment: III - A patient with                            severe systemic disease. After reviewing the risks                            and benefits, the patient was deemed in                            satisfactory condition to undergo the procedure.                           After obtaining informed consent, the colonoscope  was passed under direct vision. Throughout the                            procedure, the patient's blood pressure, pulse, and                            oxygen saturations were monitored continuously. The                            Colonoscope was introduced through the anus with                            the intention of advancing to the cecum. The scope                            was advanced to the  rectum before the procedure was                            aborted. Medications were given. The colonoscopy                            was technically difficult and complex due to poor                            bowel prep with stool present. The patient                            tolerated the procedure well. The quality of the                            bowel preparation was poor. The rectum was                            photographed. Scope In: Scope Out: 2:53:30 PM Findings:                 The perianal and digital rectal examinations were                            normal.                           A large amount of stool was found in the rectum,                            interfering with visualization. Complications:            No immediate complications. Estimated Blood Loss:     Estimated blood loss: none. Impression:               - Preparation of the colon was poor.                           - Stool in the rectum.                           -  No specimens collected. Recommendation:           - Patient has a contact number available for                            emergencies. The signs and symptoms of potential                            delayed complications were discussed with the                            patient. Return to normal activities tomorrow.                            Written discharge instructions were provided to the                            patient.                           - Clear liquid diet.                           - Continue present medications.                           - Repeat colonoscopy at the next available                            appointment because the bowel preparation was                            suboptimal.                           - For future colonoscopy the patient will require                            an extended preparation with golytly. If there are                            any questions, please contact the                             gastroenterologist. Napoleon FormKavitha V. Aeriana Speece, MD 01/20/2019 3:01:02 PM This report has been signed electronically.

## 2019-01-20 NOTE — Progress Notes (Signed)
Pt to do golytely prep for procedure on 01-22-19.  She may take Reglan 5 mg po 30 minutes before each dose of prep.  RX sent to pharmacy  golytely prep instructions reviewed with husband

## 2019-01-20 NOTE — Progress Notes (Signed)
Pt's states no medical or surgical changes since previsit or office visit. 

## 2019-01-20 NOTE — Progress Notes (Signed)
To PACU, VSS. Report to [email or web address] 

## 2019-01-22 ENCOUNTER — Telehealth: Payer: Self-pay

## 2019-01-22 ENCOUNTER — Ambulatory Visit (AMBULATORY_SURGERY_CENTER): Payer: BC Managed Care – PPO | Admitting: Gastroenterology

## 2019-01-22 ENCOUNTER — Other Ambulatory Visit: Payer: Self-pay

## 2019-01-22 ENCOUNTER — Encounter: Payer: Self-pay | Admitting: Gastroenterology

## 2019-01-22 VITALS — BP 113/53 | HR 58 | Temp 98.4°F | Resp 15 | Ht 63.5 in | Wt 215.0 lb

## 2019-01-22 DIAGNOSIS — K5900 Constipation, unspecified: Secondary | ICD-10-CM

## 2019-01-22 DIAGNOSIS — K6289 Other specified diseases of anus and rectum: Secondary | ICD-10-CM | POA: Diagnosis not present

## 2019-01-22 DIAGNOSIS — K5520 Angiodysplasia of colon without hemorrhage: Secondary | ICD-10-CM

## 2019-01-22 DIAGNOSIS — K648 Other hemorrhoids: Secondary | ICD-10-CM | POA: Diagnosis not present

## 2019-01-22 DIAGNOSIS — R933 Abnormal findings on diagnostic imaging of other parts of digestive tract: Secondary | ICD-10-CM

## 2019-01-22 MED ORDER — NALOXEGOL OXALATE 25 MG PO TABS: 25.0000 mg | ORAL_TABLET | Freq: Every day | ORAL | 3 refills | Status: AC

## 2019-01-22 MED ORDER — SODIUM CHLORIDE 0.9 % IV SOLN: 500.0000 mL | Freq: Once | INTRAVENOUS | Status: DC

## 2019-01-22 NOTE — Patient Instructions (Signed)
Resume medications and diet   Return to GI clinic in 3 months-make this appointment   Use Benefiber one teaspoon by mouth as directed three times a day   Use Linzess 290 mcg one daily   Motvantik 25 mg daily   Continue pelvic floor physical therapy   YOU HAD AN ENDOSCOPIC PROCEDURE TODAY AT THE [facility]:   Refer to the procedure report that was given to you for any specific questions about what was found during the examination.  If the procedure report does not answer your questions, please call your gastroenterologist to clarify.  If you requested that your care partner not be given the details of your procedure findings, then the procedure report has been included in a sealed envelope for you to review at your convenience later.  YOU SHOULD EXPECT: Some feelings of bloating in the abdomen. Passage of more gas than usual.  Walking can help get rid of the air that was put into your GI tract during the procedure and reduce the bloating. If you had a lower endoscopy (such as a colonoscopy or flexible sigmoidoscopy) you may notice spotting of blood in your stool or on the toilet paper. If you underwent a bowel prep for your procedure, you may not have a normal bowel movement for a few days.  Please Note:  You might notice some irritation and congestion in your nose or some drainage.  This is from the oxygen used during your procedure.  There is no need for concern and it should clear up in a day or so.  SYMPTOMS TO REPORT IMMEDIATELY:   Following lower endoscopy (colonoscopy or flexible sigmoidoscopy):  Excessive amounts of blood in the stool  Significant tenderness or worsening of abdominal pains  Swelling of the abdomen that is new, acute  Fever of 100F or higher   For urgent or emergent issues, a gastroenterologist can be reached at any hour by calling [phone].   DIET:  We do recommend a small meal at first, but then you may proceed to your regular diet.   Drink plenty of fluids but you should avoid alcoholic beverages for 24 hours.  ACTIVITY:  You should plan to take it easy for the rest of today and you should NOT DRIVE or use heavy machinery until tomorrow (because of the sedation medicines used during the test).    FOLLOW UP: Our staff will call the number listed on your records 48-72 hours following your procedure to check on you and address any questions or concerns that you may have regarding the information given to you following your procedure. If we do not reach you, we will leave a message.  We will attempt to reach you two times.  During this call, we will ask if you have developed any symptoms of COVID 19. If you develop any symptoms (ie: fever, flu-like symptoms, shortness of breath, cough etc.) before then, please call [phone].  If you test positive for Covid 19 in the 2 weeks post procedure, please call and report this information to [place].    If any biopsies were taken you will be contacted by phone or by letter within the next 1-3 weeks.  Please call us at [phone] if you have not heard about the biopsies in 3 weeks.    SIGNATURES/CONFIDENTIALITY: You and/or your care partner have signed paperwork which will be entered into your electronic medical record.  These signatures attest to the fact that that the information above on  your After Visit Summary has been reviewed and is understood.  Full responsibility of the confidentiality of this discharge information lies with you and/or your care-partner. 

## 2019-01-22 NOTE — Op Note (Signed)
[facility] Patient Name: Robin LungerMelba Hull Procedure Date: 01/22/2019 9:27 AM MRN: [id number] Endoscopist: Napoleon FormKavitha V. Tamiko Leopard , MD Age: 58 Referring MD:  Date of Birth: 1961/03/16 Gender: Female Account #: [id number] Procedure:                Colonoscopy Indications:              Evaluation of abnormal imaging study, rectal                            thickening (likely to be clinically significant),                            Incidental change in bowel habits noted, Incidental                            constipation noted Medicines:                Monitored Anesthesia Care Procedure:                Pre-Anesthesia Assessment:                           - Prior to the procedure, a History and Physical                            was performed, and patient medications and                            allergies were reviewed. The patient's tolerance of                            previous anesthesia was also reviewed. The risks                            and benefits of the procedure and the sedation                            options and risks were discussed with the patient.                            All questions were answered, and informed consent                            was obtained. Prior Anticoagulants: The patient has                            taken no previous anticoagulant or antiplatelet                            agents. ASA Grade Assessment: III - A patient with                            severe systemic disease. After reviewing the risks  and benefits, the patient was deemed in                            satisfactory condition to undergo the procedure.                           After obtaining informed consent, the colonoscope                            was passed under direct vision. Throughout the                            procedure, the patient's blood pressure, pulse, and                            oxygen saturations were monitored  continuously. The                            Colonoscope was introduced through the anus and                            advanced to the the cecum, identified by                            appendiceal orifice and ileocecal valve. The                            colonoscopy was performed without difficulty. The                            patient tolerated the procedure well. The quality                            of the bowel preparation was adequate. The                            ileocecal valve, appendiceal orifice, and rectum                            were photographed. Scope In: 9:40:13 AM Scope Out: 10:02:18 AM Scope Withdrawal Time: 0 hours 12 minutes 26 seconds  Total Procedure Duration: 0 hours 22 minutes 5 seconds  Findings:                 The perianal and digital rectal examinations were                            normal.                           A single small localized angioectasia without                            bleeding was found in the cecum.  A patchy area of mild melanosis was found in the                            transverse colon, in the ascending colon and in the                            cecum.                           Non-bleeding internal hemorrhoids were found during                            retroflexion. The hemorrhoids were medium-sized.                           The exam was otherwise without abnormality. Complications:            No immediate complications. Estimated Blood Loss:     Estimated blood loss was minimal. Impression:               - A single non-bleeding colonic angioectasia.                           - Melanosis in the colon.                           - Non-bleeding internal hemorrhoids.                           - The examination was otherwise normal.                           - No specimens collected. Recommendation:           - Patient has a contact number available for                            emergencies.  The signs and symptoms of potential                            delayed complications were discussed with the                            patient. Return to normal activities tomorrow.                            Written discharge instructions were provided to the                            patient.                           - Resume previous diet.                           - Continue present medications.                           -  Repeat colonoscopy in 10 years for screening                            purposes.                           - Return to GI clinic in 3 months.                           - Use Benefiber one teaspoon PO TID.                           - Use Linzess (linaclotide) 290 mcg PO daily.                           - Movantik 25mg  daily X 30 days with 3 refills                           -Continue pelvic floor physical therapy Napoleon Form, MD 01/22/2019 10:10:22 AM This report has been signed electronically.

## 2019-01-22 NOTE — Telephone Encounter (Signed)
  Follow up Call-  Call back number 01/22/2019 01/20/2019  Post procedure Call Back phone  # [phone]  Permission to leave phone message Yes Yes  Some recent data might be hidden     Patient questions:  Do you have a fever, pain , or abdominal swelling? No. Pain Score  0 *  Have you tolerated food without any problems? Yes.    Have you been able to return to your normal activities? Yes.    Do you have any questions about your discharge instructions: Diet   No. Medications  No. Follow up visit  No.  Do you have questions or concerns about your Care? No.  Actions: * If pain score is 4 or above: No action needed, pain <4.   1. Have you developed a fever since your procedure? no  2.   Have you had an respiratory symptoms (SOB or cough) since your procedure? no  3.   Have you tested positive for COVID 19 since your procedure no  4.   Have you had any family members/close contacts diagnosed with the COVID 19 since your procedure?  no   If yes to any of these questions please route to Laverna Peace, RN and Jennye Boroughs, Charity fundraiser.

## 2019-01-22 NOTE — Telephone Encounter (Signed)
Attempted to reach patient for post-procedure f/u call. No answer. Left message that we will make another attempt to reach her again later today and for her to please not hesitate to call us if she has any concerns regarding her care.

## 2019-01-22 NOTE — Progress Notes (Signed)
Pt's states no medical or surgical changes since previsit or office visit. 

## 2019-01-22 NOTE — Progress Notes (Signed)
A/ox3, pleased with MAC, report to RN 

## 2019-01-26 ENCOUNTER — Telehealth: Payer: Self-pay

## 2019-01-26 ENCOUNTER — Other Ambulatory Visit: Payer: BLUE CROSS/BLUE SHIELD

## 2019-01-26 ENCOUNTER — Telehealth: Payer: Self-pay | Admitting: *Deleted

## 2019-01-26 NOTE — Telephone Encounter (Signed)
  Left message to call us as needed.

## 2019-01-26 NOTE — Telephone Encounter (Signed)
  Follow up Call-  Call back number 01/22/2019 01/20/2019  Post procedure Call Back phone  # [phone]  Permission to leave phone message Yes Yes  Some recent data might be hidden     Patient questions:  "Your call cannot be completed.  Number checked x 2.

## 2019-02-02 ENCOUNTER — Other Ambulatory Visit: Payer: BLUE CROSS/BLUE SHIELD

## 2019-02-03 ENCOUNTER — Other Ambulatory Visit: Payer: Self-pay

## 2019-02-03 DIAGNOSIS — K5902 Outlet dysfunction constipation: Secondary | ICD-10-CM

## 2019-02-03 DIAGNOSIS — M533 Sacrococcygeal disorders, not elsewhere classified: Secondary | ICD-10-CM

## 2019-02-03 DIAGNOSIS — R339 Retention of urine, unspecified: Secondary | ICD-10-CM

## 2019-02-08 ENCOUNTER — Telehealth: Payer: Self-pay | Admitting: Gastroenterology

## 2019-02-08 NOTE — Telephone Encounter (Signed)
Pt's husband reported that pt is constipated and would like advice.

## 2019-02-08 NOTE — Telephone Encounter (Signed)
Attempted to return call, no answer.

## 2019-02-09 NOTE — Telephone Encounter (Signed)
Pt called and states with a coupon the movantik was going to cost them 453.00. 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 She states she does not take any opioids. They want to know if she feels this will work for her, if so they are willing to spend the money to see if this helps. If not they are interested in different options. Please advise.

## 2019-02-10 NOTE — Telephone Encounter (Signed)
Spoke with pt and she is aware, samples up front for pt to pick up.

## 2019-02-10 NOTE — Telephone Encounter (Signed)
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  I think we have some samples, Robin Hull has kept them aside.  Please advise patient to come and pick up the samples to try and see if it helps first before she fills the prescription.  Thank you

## 2019-02-15 ENCOUNTER — Telehealth: Payer: Self-pay | Admitting: Gastroenterology

## 2019-02-15 NOTE — Telephone Encounter (Signed)
Pt called stating that she has been taken movantik for 6 days and it has only worked one day. She would also like to be referral to Dr. Jenne Campus (neurosurgeon) for [facility] in [place] instead of where she was referred. Pls call her as she has other questions regarding Movantik.

## 2019-02-16 ENCOUNTER — Telehealth: Payer: Self-pay

## 2019-02-16 NOTE — Telephone Encounter (Signed)
Left message for patient to call back  

## 2019-02-16 NOTE — Telephone Encounter (Signed)
Patient called and states she has not had a BM for 5 days except for a small amount last night after she did an enema. She states the Movantik is not working. She stopped her linzess and dulcolax supp. 6 days ago when she started the Movantik, because she didn't think she was suppose to take them together. She is still taking a stool softener BID. Also patient is requesting we do a different referral for the Neurosurgeon. She would like Dr. Jenne Campus with [facility] ([place] in [place]. Please advise.

## 2019-02-17 NOTE — Telephone Encounter (Signed)
Please advise patient to take both Linzess daily before breakfast and Movantik.  Dulcolax suppository only as needed.  Okay to send referral to Dr. Harl Bowie to evaluate for possible sacral plexus injury with history of urinary/fecal retention and pelvic floor dysfunction.  Thank you

## 2019-02-17 NOTE — Telephone Encounter (Signed)
Called patient and left message to take the Linzess before breakfast, along with the Movantik daily. Also let her know we would be doing the referral to Dr. Jenne Campus. Please call us back if any further questions

## 2019-03-04 ENCOUNTER — Encounter: Payer: Self-pay | Admitting: Physician Assistant

## 2019-03-04 ENCOUNTER — Telehealth: Payer: Self-pay | Admitting: Gastroenterology

## 2019-03-04 NOTE — Telephone Encounter (Signed)
Pt called stating that she just called the neurologist that Dr. Silverio Decamp referred her to but MD was not sure of why pt was referred and pt either. She would like a call back with that answer.

## 2019-03-04 NOTE — Telephone Encounter (Signed)
The neurosurgeon at the [facility] was not able to help her. She is going to wait to see Dr Harl Bowie. She is taking Movantek and Linzess. She has had to use an enema. She will continue the medications. Use suppository only when necessary. She states that does not help. She will use the enema only if necessary and continue the medications.

## 2019-03-05 NOTE — Telephone Encounter (Signed)
Unfortunately we are not able to improve her symptoms.  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 Please send referral to [facility] for additional evaluation as we cannot do the testing here.

## 2019-03-08 NOTE — Telephone Encounter (Signed)
Spoke with the patient. She states she has been told of Dr Jenne Campus. She wants a referral to him. Called to Dr Nelly Laurence office [phone]. No record of the referral. Confirmed the fax number. Records faxed to [phone].

## 2019-03-22 NOTE — Telephone Encounter (Signed)
Patient states that Dr. Nelly Laurence office is still saying that they do not have a referral on this patient. Patient requesting callback. [phone]

## 2019-03-22 NOTE — Telephone Encounter (Signed)
Spoke with Dr Nelly Laurence office. Scheduler confirms records are in their possession and were received on 02/17/2019. She also confirms they have been trying to contact the patient but no answer. Called the patient confirmed she had the correct phone number. Advised she call Dr Nelly Laurence office now. Records are there.

## 2019-09-03 ENCOUNTER — Other Ambulatory Visit: Payer: Self-pay | Admitting: Pain Medicine

## 2019-09-03 DIAGNOSIS — M5416 Radiculopathy, lumbar region: Secondary | ICD-10-CM

## 2019-09-11 ENCOUNTER — Other Ambulatory Visit: Payer: Self-pay

## 2019-09-11 ENCOUNTER — Ambulatory Visit
Admission: RE | Admit: 2019-09-11 | Discharge: 2019-09-11 | Disposition: A | Discharge status: Home / Self Care | Payer: BC Managed Care – PPO | Source: Ambulatory Visit | Attending: Pain Medicine | Admitting: Pain Medicine

## 2019-09-11 DIAGNOSIS — M5416 Radiculopathy, lumbar region: Secondary | ICD-10-CM

## 2020-04-11 ENCOUNTER — Emergency Department (HOSPITAL_COMMUNITY)
Admission: EM | Admit: 2020-04-11 | Discharge: 2020-04-11 | Discharge status: Against Medical Advice | Payer: BC Managed Care – PPO | Attending: Emergency Medicine | Admitting: Emergency Medicine

## 2020-04-11 ENCOUNTER — Other Ambulatory Visit: Payer: Self-pay

## 2020-04-11 ENCOUNTER — Encounter (HOSPITAL_COMMUNITY): Payer: Self-pay

## 2020-04-11 DIAGNOSIS — R109 Unspecified abdominal pain: Secondary | ICD-10-CM | POA: Diagnosis present

## 2020-04-11 DIAGNOSIS — Z5321 Procedure and treatment not carried out due to patient leaving prior to being seen by health care provider: Secondary | ICD-10-CM | POA: Diagnosis not present

## 2020-04-11 NOTE — ED Triage Notes (Signed)
Pt presents with c/o stomach pain. Pt reports she had a spinal stimulator placed last week but says that has nothing to do with her pain. Pt reports that when she was in the office yesterday in relation to the stimulator, the nurse noticed that her stomach was swollen and the nurse told her that she felt a mass. Pt reports the abdominal pain started approx 2 months ago.

## 2020-09-15 IMAGING — MR MRI PELVIS WITHOUT AND WITH CONTRAST
5 of 8 series · 30 of 48 positions shown · IV contrast (multihance)
Comparison: None.

CLINICAL DATA: 58-year-old female with history of low back pain and
groin pain with loss of sensation in the labial region. Unable to
have bowel movements without taking an enema. Painful urination.

EXAM:
MRI PELVIS WITHOUT AND WITH CONTRAST
TECHNIQUE: Multiplanar multisequence MR imaging of the pelvis was performed
both before and after administration of intravenous contrast.
CONTRAST:  20mL MULTIHANCE GADOBENATE DIMEGLUMINE 529 MG/ML IV SOLN

[Series 3: T1 · coronal · 4.0mm · 1.19mm/px · 6 of 39 slices shown (1 of 2)]
[im 1/39]
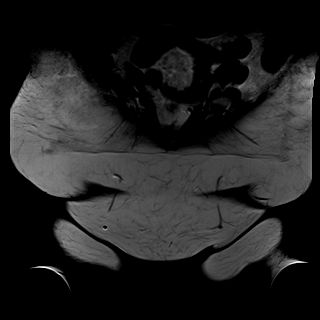
[im 8/39]
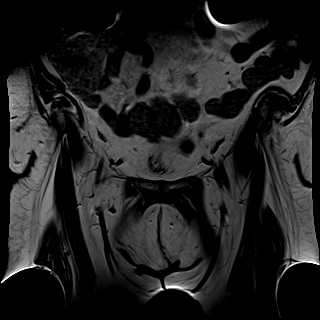
[im 16/39]
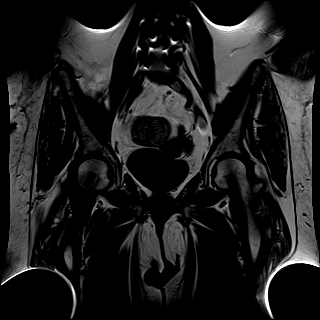
[im 23/39]
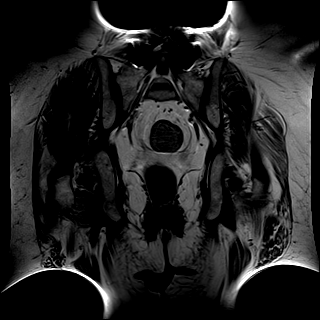
[im 31/39]
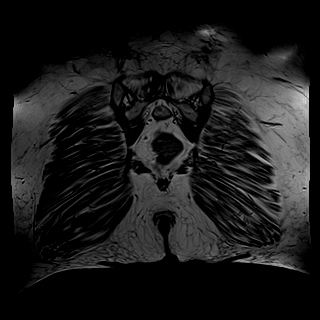
[im 39/39]
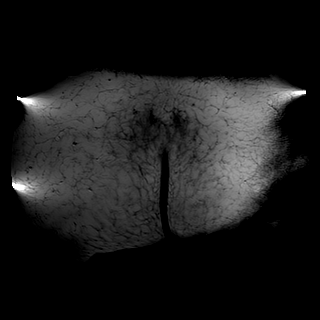

[Series 4: STIR · coronal · 4.0mm · 0.74mm/px · 5 of 39 slices shown]
[im 1/39]
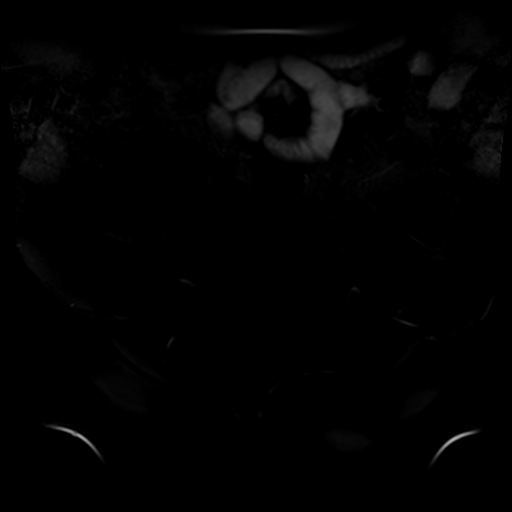
[im 10/39]
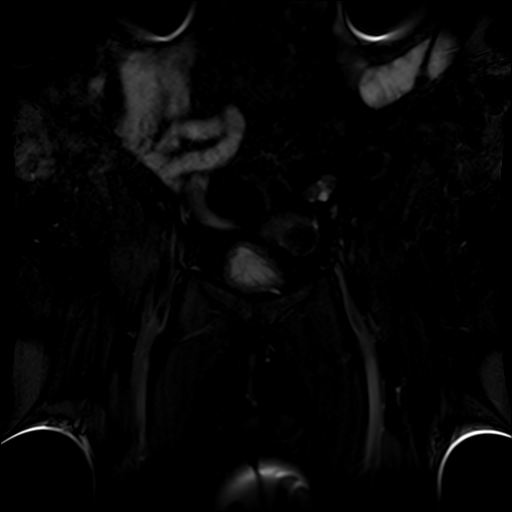
[im 20/39]
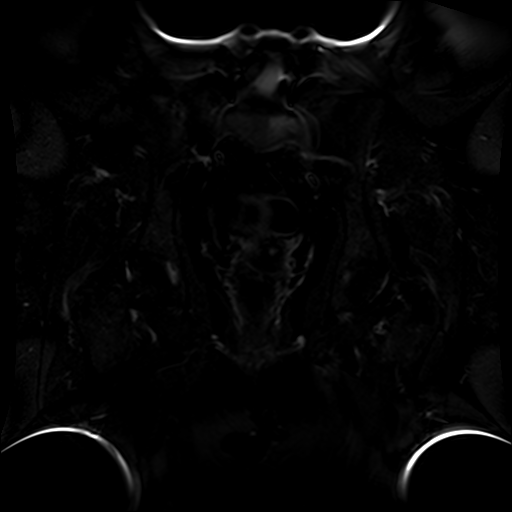
[im 29/39]
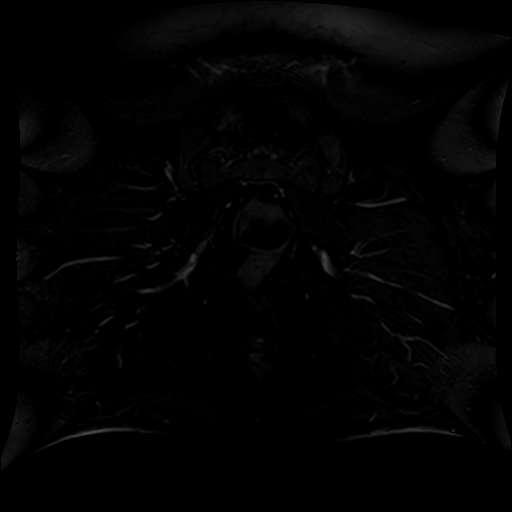
[im 39/39]
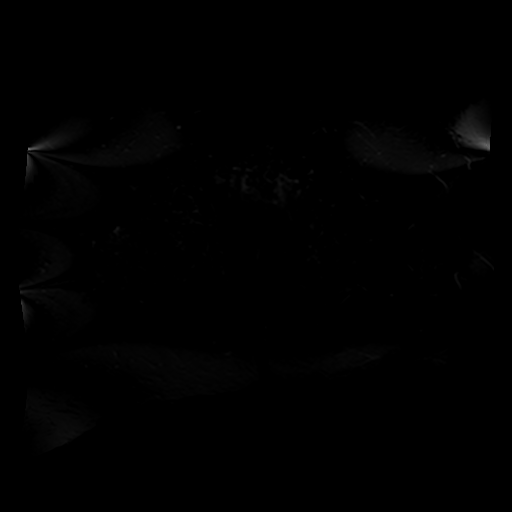

[Series 5: T1 · axial · 4.0mm · 1.22mm/px · z∈[-69,+174]mm · 6 of 46 slices shown (2 of 2)]
[im 1/46]
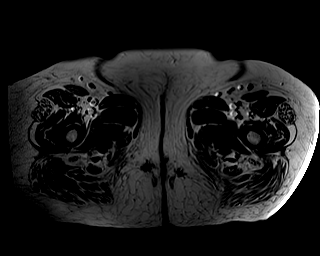
[im 10/46]
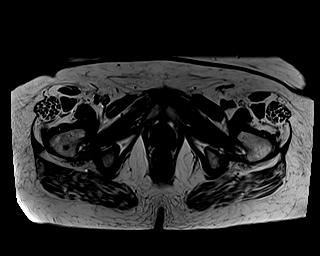
[im 19/46]
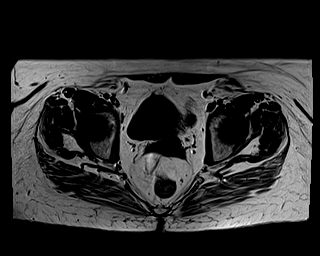
[im 28/46]
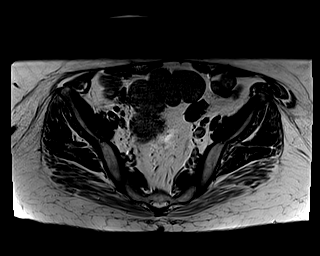
[im 37/46]
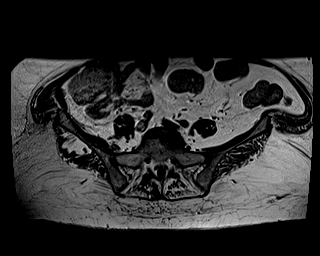
[im 46/46]
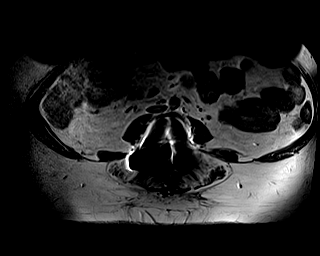

[Series 6: T2 fat-sat · axial · 4.0mm · 1.22mm/px · z∈[-69,+174]mm · 6 of 46 slices shown (1 of 2)]
[im 1/46]
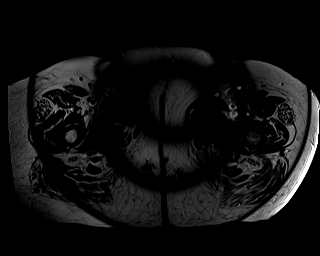
[im 10/46]
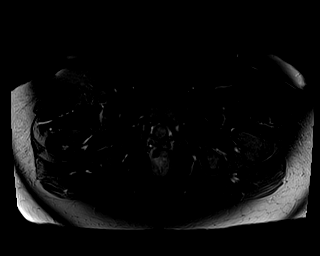
[im 19/46]
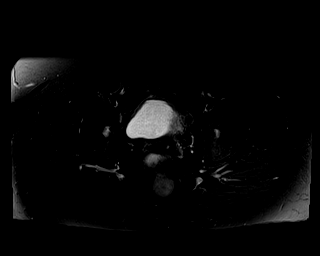
[im 28/46]
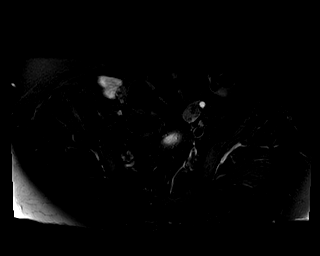
[im 37/46]
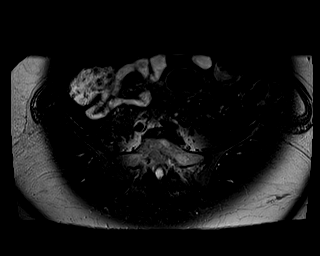
[im 46/46]
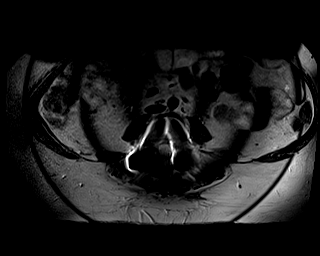

[Series 7: T2 fat-sat · sagittal · 4.0mm · 0.57mm/px · 7 of 59 slices shown (2 of 2)]
[im 1/59]
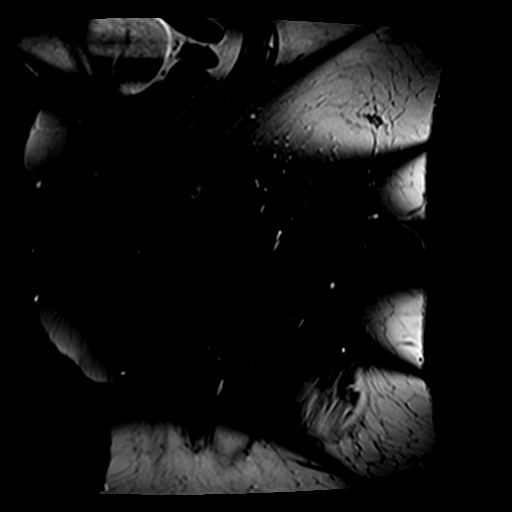
[im 9/59]
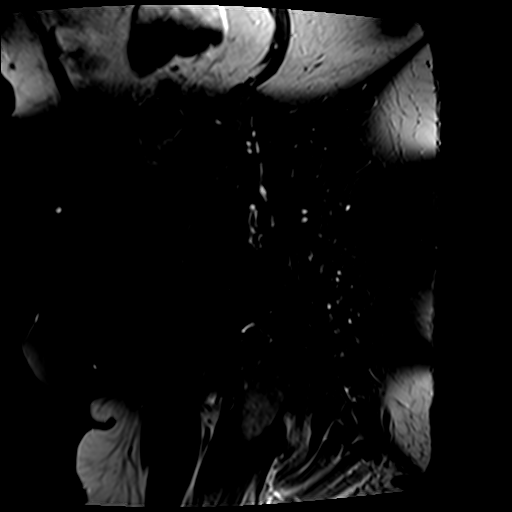
[im 17/59]
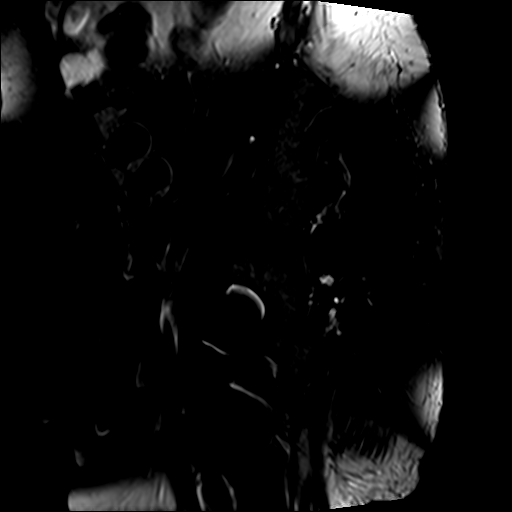
[im 25/59]
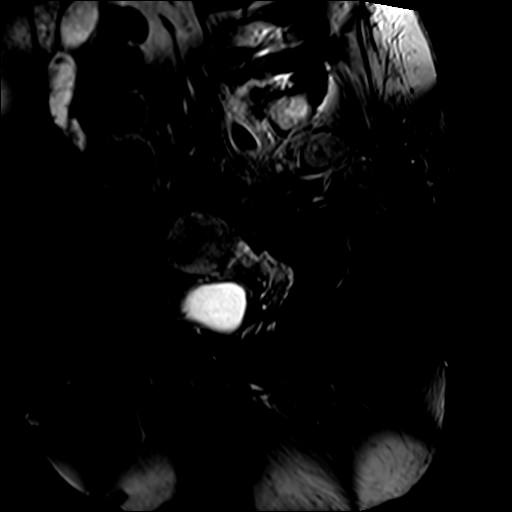
[im 34/59]
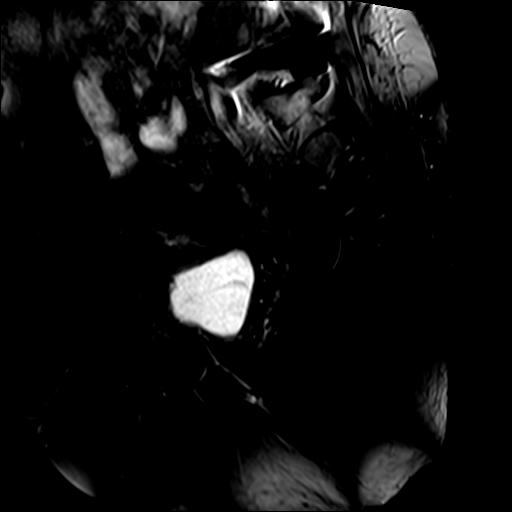
[im 42/59]
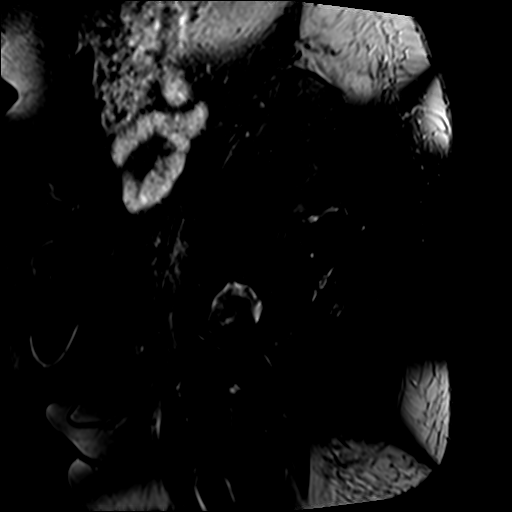
[im 50/59]
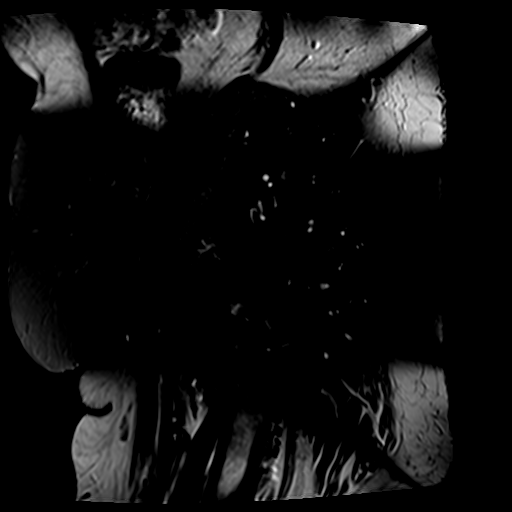

[30 of 48 positions shown; findings below may reference images not displayed]

FINDINGS: Creatinine was obtained on site at [HOSPITAL] at [HOSPITAL].

Results: Creatinine 0.7 mg/dL.

Urinary Tract: Distal ureters and urinary bladder are unremarkable
in appearance.

Bowel: In the proximal rectum there is some asymmetric mural
thickening which extends over a length of approximately 5.4 cm and
measures up to approximately 2.5 x 2.9 cm (sagittal image 30 of
series 7 and axial image 28 of series 6).

Vascular/Lymphatic: No aneurysm identified in the visualized pelvic
vasculature. No lymphadenopathy noted in the pelvis.

Reproductive: There are 2 lesions in the left side of the uterine
body and fundus which demonstrate predominantly T1 intermediate to
low intensity, T2 hypointensity, and heterogeneous internal
enhancement, compatible with small fibroids, largest of which is in
the fundal region measuring 2.8 x 2.7 x 2.4 cm. Bilateral ovaries
are unremarkable in appearance.

Other: No significant volume of ascites noted in the peritoneal
cavity.

Musculoskeletal: No aggressive appearing osseous lesions are noted
in the visualized portions of the skeleton. Susceptibility artifact
in the lower lumbar spine related to indwelling spinal hardware from
prior PLIF.
IMPRESSION: 1. Asymmetric mural thickening in the proximal rectum concerning for
potential rectal mass. No definite lymphadenopathy identified on
today's examination. Further evaluation with nonemergent colonoscopy
is suggested in the near future to better evaluate this finding.
2. Fibroid uterus.
